# Patient Record
Sex: Male | Born: 1970 | Race: White | Hispanic: No | Marital: Single | Smoking: Current every day smoker
Health system: Southern US, Community
[De-identification: ages and names within clinical notes are randomized; demographics above are authoritative.]

## PROBLEM LIST (undated history)

## (undated) HISTORY — PX: CERVICAL SPINE SURGERY: SHX589

## (undated) HISTORY — PX: HERNIA REPAIR: SHX51

## (undated) HISTORY — PX: SHOULDER SURGERY: SHX246

## (undated) HISTORY — PX: KNEE SURGERY: SHX244

---

## 2019-11-02 ENCOUNTER — Emergency Department (HOSPITAL_COMMUNITY): Payer: Self-pay

## 2019-11-02 ENCOUNTER — Encounter (HOSPITAL_COMMUNITY): Payer: Self-pay

## 2019-11-02 ENCOUNTER — Emergency Department (HOSPITAL_COMMUNITY)
Admission: EM | Admit: 2019-11-02 | Discharge: 2019-11-02 | Disposition: A | Discharge status: Home / Self Care | Payer: Self-pay | Attending: Emergency Medicine | Admitting: Emergency Medicine

## 2019-11-02 ENCOUNTER — Other Ambulatory Visit: Payer: Self-pay

## 2019-11-02 DIAGNOSIS — R531 Weakness: Secondary | ICD-10-CM | POA: Insufficient documentation

## 2019-11-02 DIAGNOSIS — R072 Precordial pain: Secondary | ICD-10-CM | POA: Insufficient documentation

## 2019-11-02 DIAGNOSIS — R42 Dizziness and giddiness: Secondary | ICD-10-CM | POA: Insufficient documentation

## 2019-11-02 DIAGNOSIS — F17210 Nicotine dependence, cigarettes, uncomplicated: Secondary | ICD-10-CM | POA: Insufficient documentation

## 2019-11-02 LAB — TROPONIN I (HIGH SENSITIVITY)
Troponin I (High Sensitivity): <2 ng/L (ref <18)
Troponin I (High Sensitivity): <2 ng/L (ref <18)

## 2019-11-02 LAB — BASIC METABOLIC PANEL
Anion gap: 8 (ref 5–15)
BUN: 11 mg/dL (ref 6–20)
CO2: 24 mmol/L (ref 22–32)
Calcium: 9.2 mg/dL (ref 8.9–10.3)
Chloride: 106 mmol/L (ref 98–111)
Creatinine, Ser: 1.04 mg/dL (ref 0.61–1.24)
GFR calc Af Amer: >60 mL/min (ref >60)
GFR calc non Af Amer: >60 mL/min (ref >60)
Glucose, Bld: 114 mg/dL — ABNORMAL HIGH (ref 70–99)
Potassium: 4.2 mmol/L (ref 3.5–5.1)
Sodium: 138 mmol/L (ref 135–145)

## 2019-11-02 LAB — CBC
HCT: 46.1 % (ref 39.0–52.0)
Hemoglobin: 14.9 g/dL (ref 13.0–17.0)
MCH: 29.6 pg (ref 26.0–34.0)
MCHC: 32.3 g/dL (ref 30.0–36.0)
MCV: 91.7 fL (ref 80.0–100.0)
Platelets: 242 10*3/uL (ref 150–400)
RBC: 5.03 MIL/uL (ref 4.22–5.81)
RDW: 13.8 % (ref 11.5–15.5)
WBC: 10.6 10*3/uL — ABNORMAL HIGH (ref 4.0–10.5)
nRBC: 0 % (ref 0.0–0.2)

## 2019-11-02 MED ORDER — ASPIRIN 81 MG PO CHEW
324.0000 mg | CHEWABLE_TABLET | Freq: Once | ORAL | Status: AC
  Administered 2019-11-02: 324 mg via ORAL
  Filled 2019-11-02: qty 4

## 2019-11-02 MED ORDER — SODIUM CHLORIDE 0.9% FLUSH: 3.0000 mL | Freq: Once | INTRAVENOUS | Status: DC

## 2019-11-02 NOTE — Discharge Instructions (Addendum)
Please read instructions below. The Dr. Marlou Porch office, cardiology, will be contacting you in the next couple of days to schedule further outpatient studies and follow up. If you have not heard from them in the next 2 days, call their office.   Return to the [facility] ER for new or worsening symptoms; including worsening chest pain, shortness of breath, pain that radiates to the arm or neck, pain or shortness of breath worsened with exertion.

## 2019-11-02 NOTE — ED Triage Notes (Signed)
Patient states he began having chest pain/spasm left chest with dizziness, and weakness since 0730 today while on his job. Patient states he has had this before,but never this bad.

## 2019-11-02 NOTE — ED Notes (Addendum)
Pt is requesting to leave, saying he has to get back to work, and that he is sorry he came.

## 2019-11-02 NOTE — ED Provider Notes (Signed)
[facility]-EMERGENCY DEPT Provider Note   CSN: [id number] Arrival date & time: 11/02/19  1003     History Chief Complaint  Patient presents with  . Chest Pain    Danny Ramirez is a 48 y.o. male presenting to the emergency department with complaint of chest pain that began around 7:30 AM this morning.  He states he was at work and he just climbed a ladder to a roof when he began having a very severe central/left-sided sharp chest pain that radiated towards his neck.  He states it was so severe brought him to his knees and lasted about 2 hours in duration.  He states he gets recurrence of symptoms if he has any exertion or movement since that time.  He did not have any nausea or diaphoresis.  He states he has had chest pains in the past, however never this severe.  No medications tried for symptoms.  He had a cardiac stress test done in 2016 which was normal. He reports history of borderline hypertension and his father with significant heart disease before the age of 49.  He also uses tobacco occasionally.  The history is provided by the patient.  Chest Pain      History reviewed. No pertinent past medical history.  There are no problems to display for this patient.   Past Surgical History:  Procedure Laterality Date  . CERVICAL SPINE SURGERY    . HERNIA REPAIR    . KNEE SURGERY Left   . SHOULDER SURGERY Left    x 2       Family History  Problem Relation Age of Onset  . Deep vein thrombosis Mother   . Heart attack Father     Social History   Tobacco Use  . Smoking status: Current Every Day Smoker    Packs/day: 0.15    Types: Cigarettes  . Smokeless tobacco: Never Used  Substance Use Topics  . Alcohol use: Never  . Drug use: Never    Home Medications Prior to Admission medications   Medication Sig Start Date End Date Taking? Authorizing Provider  ARIPiprazole (ABILIFY) 10 MG tablet Take 10 mg by mouth daily. 09/28/19  Yes  [provider]  buPROPion (WELLBUTRIN SR) 150 MG 12 hr tablet Take 150 mg by mouth 2 (two) times daily. 10/06/19  Yes [provider]  lamoTRIgine (LAMICTAL) 150 MG tablet Take 150 mg by mouth daily. 07/27/19  Yes [provider]  lithium carbonate 300 MG capsule Take 300-600 mg by mouth See admin instructions. Take 300 mg by mouth in the morning and 600 mg by mouth in the evening. 10/27/19  Yes [provider]    Allergies    Patient has no known allergies.  Review of Systems   Review of Systems  Cardiovascular: Positive for chest pain.  All other systems reviewed and are negative.   Physical Exam Updated Vital Signs BP 133/89   Pulse 68   Temp 97.9 F (36.6 C) (Oral)   Resp 18   Ht 5\' 7"  (1.702 m)   Wt 90.7 kg   SpO2 96%   BMI 31.32 kg/m   Physical Exam Vitals and nursing note reviewed.  Constitutional:      General: He is not in acute distress.    Appearance: He is well-developed. He is not ill-appearing.  HENT:     Head: Normocephalic and atraumatic.  Eyes:     Conjunctiva/sclera: Conjunctivae normal.  Cardiovascular:     Rate  and Rhythm: Normal rate and regular rhythm.  Pulmonary:     Effort: Pulmonary effort is normal. No respiratory distress.     Breath sounds: Normal breath sounds.  Chest:     Chest wall: No tenderness.  Abdominal:     General: Bowel sounds are normal.     Palpations: Abdomen is soft.     Tenderness: There is no abdominal tenderness.  Musculoskeletal:     Right lower leg: No edema.     Left lower leg: No edema.  Skin:    General: Skin is warm.  Neurological:     Mental Status: He is alert.  Psychiatric:        Behavior: Behavior normal.     ED Results / Procedures / Treatments   Labs (all labs ordered are listed, but only abnormal results are displayed) Labs Reviewed  BASIC METABOLIC PANEL - Abnormal; Notable for the following components:      Result Value   Glucose, Bld 114 (*)    All  other components within normal limits  CBC - Abnormal; Notable for the following components:   WBC 10.6 (*)    All other components within normal limits  TROPONIN I (HIGH SENSITIVITY)  TROPONIN I (HIGH SENSITIVITY)    EKG EKG Interpretation  Date/Time:  Thursday November 02 2019 10:13:17 EST Ventricular Rate:  70 PR Interval:    QRS Duration: 86 QT Interval:  381 QTC Calculation: 412 R Axis:   52 Text Interpretation: Sinus rhythm Baseline wander in lead(s) V2 Confirmed by Nat Christen [phone]) on 11/02/2019 11:48:02 AM   Radiology DG Chest 2 View  Result Date: 11/02/2019 CLINICAL DATA:  Chest pain EXAM: CHEST - 2 VIEW COMPARISON:  None. FINDINGS: The heart size and mediastinal contours are within normal limits. Both lungs are clear. The visualized skeletal structures are unremarkable. IMPRESSION: No active cardiopulmonary disease. Electronically Signed   By: Franchot Gallo M.D.   On: 11/02/2019 10:55    Procedures Procedures (including critical care time)  Medications Ordered in ED Medications  sodium chloride flush (NS) 0.9 % injection 3 mL (has no administration in time range)  aspirin chewable tablet 324 mg (324 mg Oral Given 11/02/19 1124)    ED Course  I have reviewed the triage vital signs and the nursing notes.  Pertinent labs & imaging results that were available during my care of the patient were reviewed by me and considered in my medical decision making (see chart for details).  Clinical Course as of Nov 01 1522  Thu Nov 02, 2019  1411 Patient discussed with Dr. Marlou Porch with cardiology.  He reviewed patient's work-up today and discussed patient's presentation.  He recommends at this time patient is appropriate for Outpt follow up for CT. cardiology to coordinate care.  Appreciate consult and assistance.   [JR]    Clinical Course User Index [JR] Rhian Asebedo, [place] N, PA-C   MDM Rules/Calculators/A&P                      Patient presenting with substernal  chest pain that began while at work today after climbing a ladder.  He describes it as a sharp and worse with exertion though is not reproducible with particular movements or palpation.  He has no known cardiac history with negative exercise stress test in 2016, however his risk factors include hypertension, tobacco use and positive family history.  On evaluation he is in no distress and not currently having pain.  Work-up today  is very reassuring, negative troponin x2, chest x-ray is negative and EKG without ischemic changes.  Given patient's concerning presentation, discussed with Dr. Anne Fu with cardiology who recommends no evidence of MI today, and patient is appropriate for discharge with close outpatient follow-up.  Cardiology to coordinate outpatient CT and follow-up appointment.  Discussed recommendations with patient who is agreeable with plan.  Discussed results, findings, treatment and follow up. Patient advised of return precautions. Patient verbalized understanding and agreed with plan.  Final Clinical Impression(s) / ED Diagnoses Final diagnoses:  Precordial chest pain    Rx / DC Orders ED Discharge Orders    None       Tanayia Wahlquist, [place] N, PA-C 11/02/19 1523    Donnetta Hutching, MD 11/04/19 1109

## 2019-11-08 ENCOUNTER — Telehealth: Payer: Self-pay | Admitting: *Deleted

## 2019-11-08 NOTE — Telephone Encounter (Signed)
A message was left, re: his new patient appointment. 

## 2020-05-17 IMAGING — CR DG CHEST 2V
2 series · 2 of 2 positions shown · non-contrast
Comparison: None.

CLINICAL DATA: Chest pain

EXAM:
CHEST - 2 VIEW

[w chest pa]
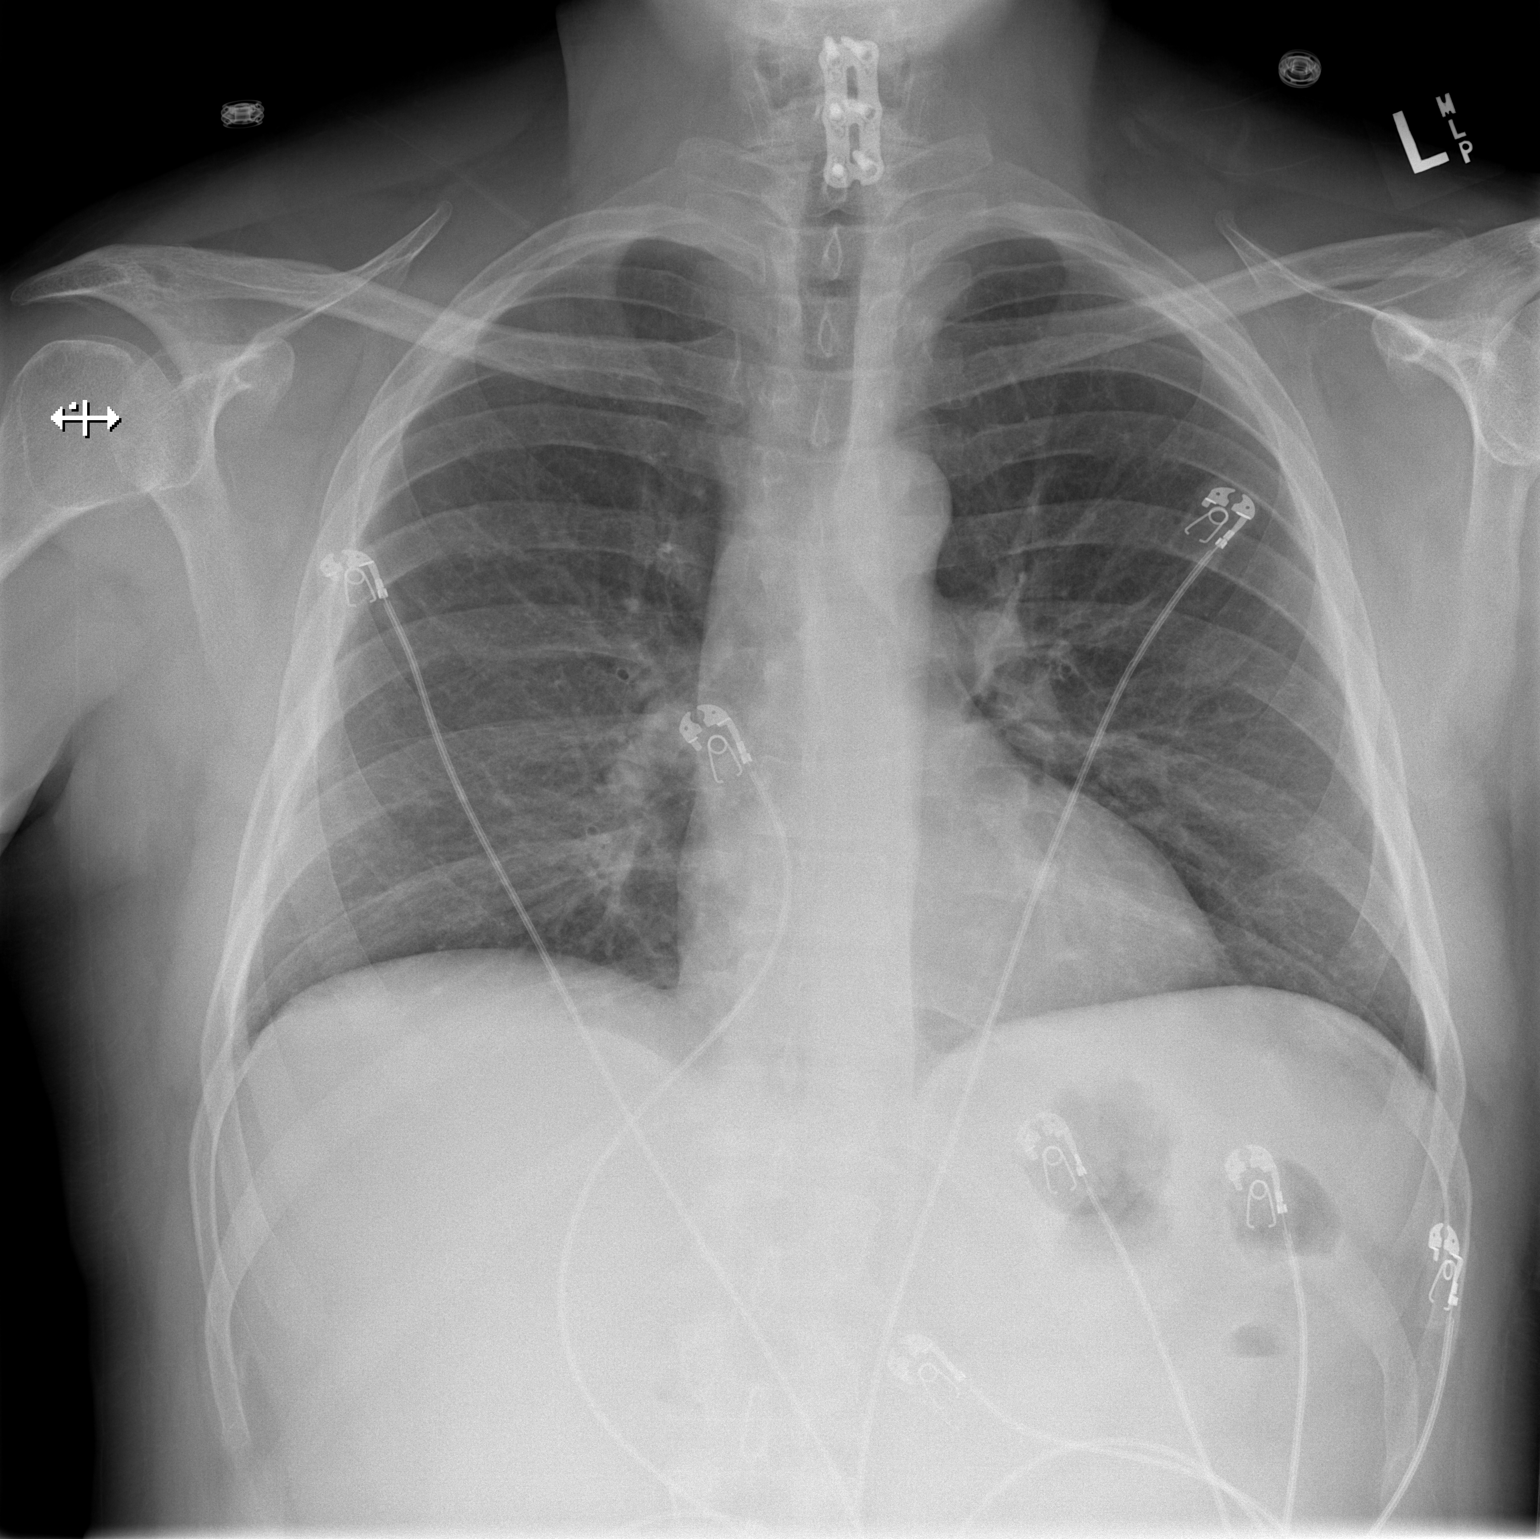

[w chest lat]
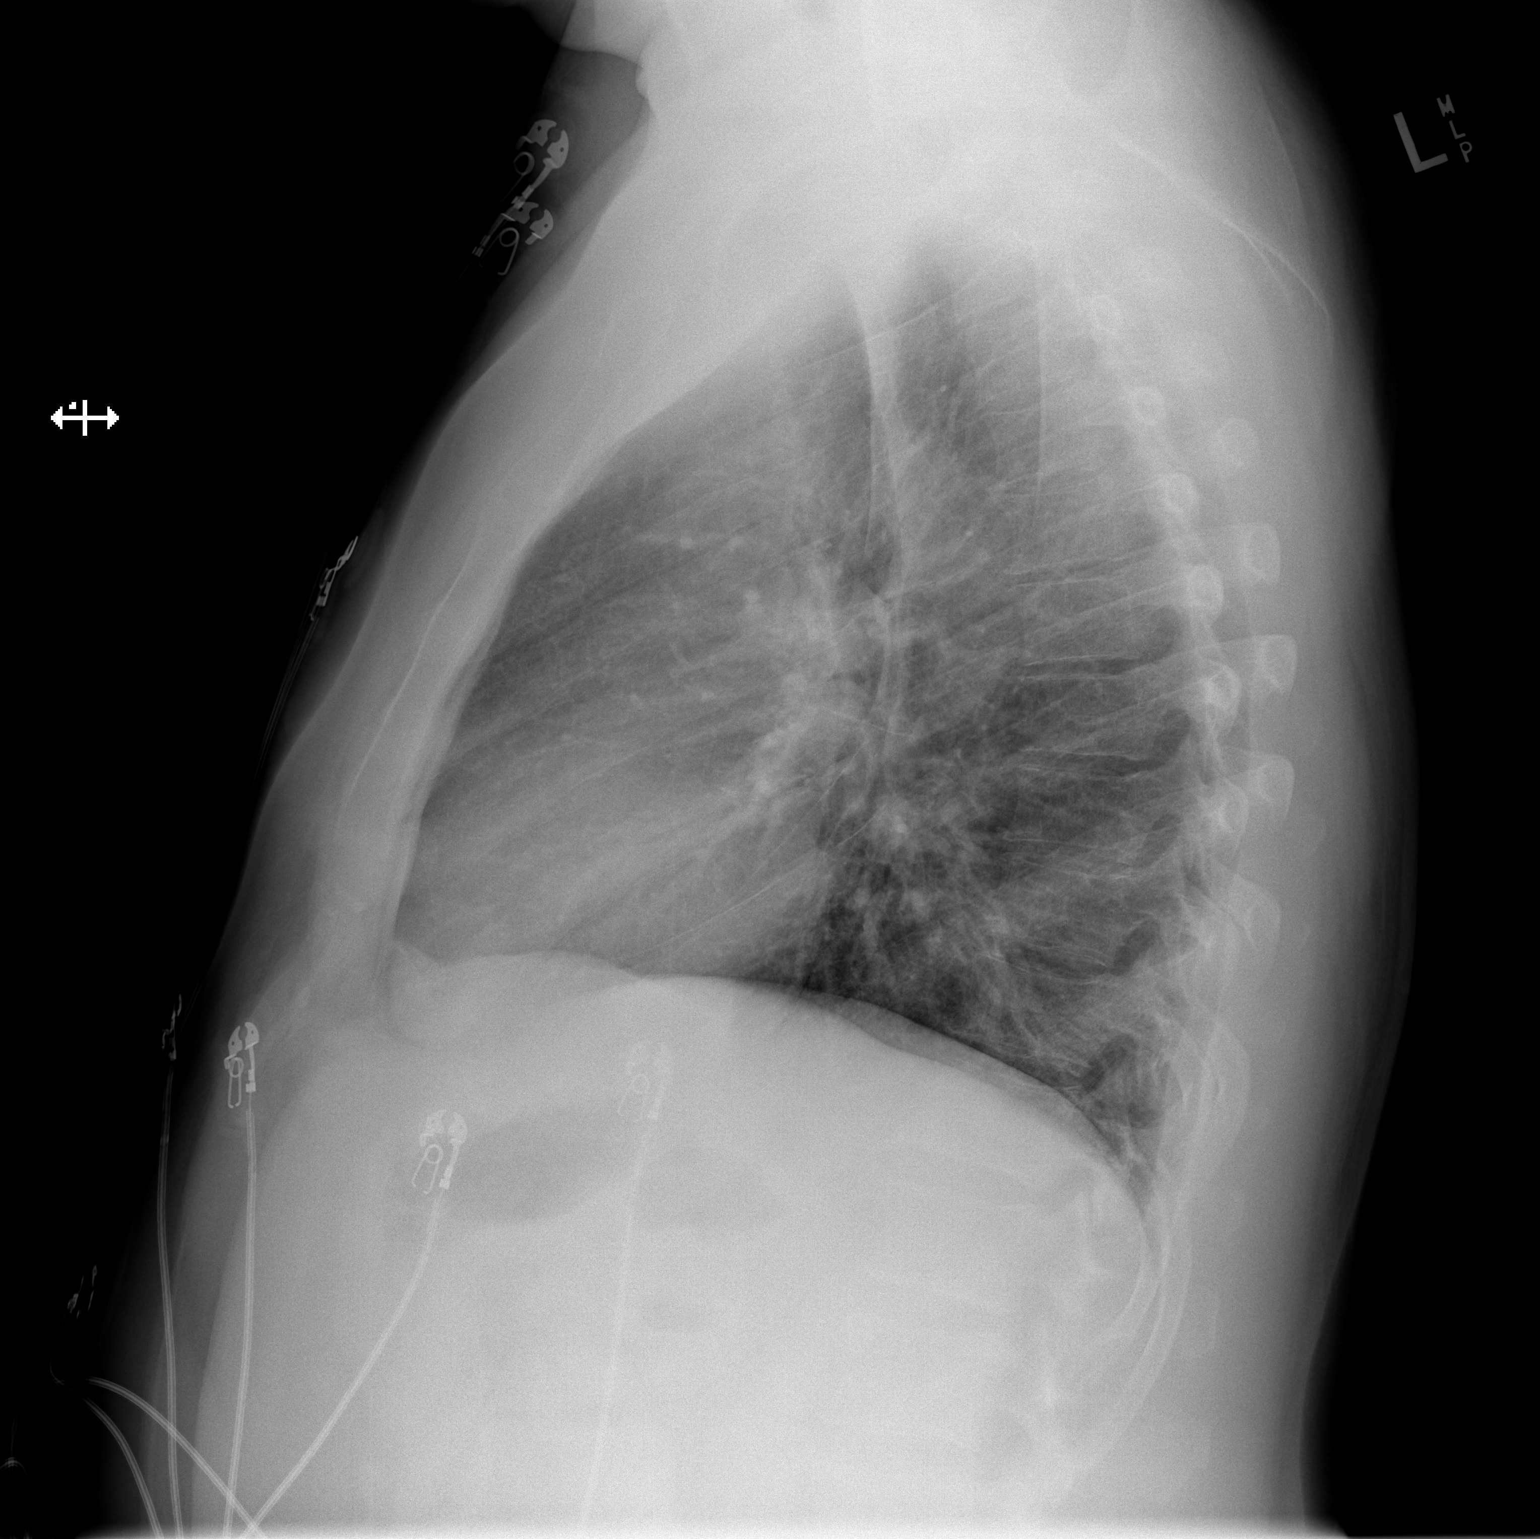

[2 of 2 positions shown; findings below may reference images not displayed]

FINDINGS: The heart size and mediastinal contours are within normal limits.
Both lungs are clear. The visualized skeletal structures are
unremarkable.
IMPRESSION: No active cardiopulmonary disease.
